# Patient Record
Sex: Male | Born: 1980 | Race: White | Hispanic: No | Marital: Married | Smoking: Former smoker
Health system: Southern US, Community
[De-identification: ages and names within clinical notes are randomized; demographics above are authoritative.]

## PROBLEM LIST (undated history)

## (undated) DIAGNOSIS — I10 Essential (primary) hypertension: Secondary | ICD-10-CM

## (undated) DIAGNOSIS — J45909 Unspecified asthma, uncomplicated: Secondary | ICD-10-CM

## (undated) HISTORY — DX: Unspecified asthma, uncomplicated: J45.909

## (undated) HISTORY — DX: Essential (primary) hypertension: I10

---

## 2018-08-11 ENCOUNTER — Encounter: Payer: Self-pay | Admitting: Family Medicine

## 2018-08-11 ENCOUNTER — Ambulatory Visit: Payer: BC Managed Care – PPO | Admitting: Family Medicine

## 2018-08-11 VITALS — BP 140/90 | HR 77 | Temp 98.5°F | Resp 15 | Ht 66.0 in | Wt 226.6 lb

## 2018-08-11 DIAGNOSIS — G43109 Migraine with aura, not intractable, without status migrainosus: Secondary | ICD-10-CM

## 2018-08-11 DIAGNOSIS — Z83438 Family history of other disorder of lipoprotein metabolism and other lipidemia: Secondary | ICD-10-CM

## 2018-08-11 DIAGNOSIS — Z8249 Family history of ischemic heart disease and other diseases of the circulatory system: Secondary | ICD-10-CM

## 2018-08-11 LAB — LIPID PANEL
CHOL/HDL RATIO: 4
CHOLESTEROL: 185 mg/dL (ref 0–200)
HDL: 44.2 mg/dL (ref >39.00)
NonHDL: 141.05
TRIGLYCERIDES: 275 mg/dL — AB (ref 0.0–149.0)
VLDL: 55 mg/dL — ABNORMAL HIGH (ref 0.0–40.0)

## 2018-08-11 LAB — CBC
HEMATOCRIT: 45.8 % (ref 39.0–52.0)
Hemoglobin: 15.6 g/dL (ref 13.0–17.0)
MCHC: 34.1 g/dL (ref 30.0–36.0)
MCV: 86.1 fl (ref 78.0–100.0)
Platelets: 270 10*3/uL (ref 150.0–400.0)
RBC: 5.32 Mil/uL (ref 4.22–5.81)
RDW: 12.6 % (ref 11.5–15.5)
WBC: 7.6 10*3/uL (ref 4.0–10.5)

## 2018-08-11 LAB — COMPREHENSIVE METABOLIC PANEL
ALBUMIN: 4.5 g/dL (ref 3.5–5.2)
ALT: 20 U/L (ref 0–53)
AST: 19 U/L (ref 0–37)
Alkaline Phosphatase: 36 U/L — ABNORMAL LOW (ref 39–117)
BUN: 14 mg/dL (ref 6–23)
CHLORIDE: 105 meq/L (ref 96–112)
CO2: 30 meq/L (ref 19–32)
Calcium: 9.6 mg/dL (ref 8.4–10.5)
Creatinine, Ser: 1.08 mg/dL (ref 0.40–1.50)
GFR: 81.33 mL/min (ref >60.00)
Glucose, Bld: 103 mg/dL — ABNORMAL HIGH (ref 70–99)
POTASSIUM: 4.6 meq/L (ref 3.5–5.1)
SODIUM: 140 meq/L (ref 135–145)
Total Bilirubin: 0.3 mg/dL (ref 0.2–1.2)
Total Protein: 7.5 g/dL (ref 6.0–8.3)

## 2018-08-11 LAB — LDL CHOLESTEROL, DIRECT: Direct LDL: 127 mg/dL

## 2018-08-11 NOTE — Patient Instructions (Signed)
Propranolol is a beta blocker that can be used to control BP and also can help be a preventative medication for migraines.  Try OTC Excedrin migraine to see if this better controls migraine pain.   Take no more than 3600mg  of Ibuprofen in 24 hours, always take ibuprofen with food (this med is tough on your gut)   Recurrent Migraine Headache A migraine headache is very bad, throbbing pain that is usually on one side of your head. Recurrent migraines keep coming back (recurring). Talk with your doctor about what things may bring on (trigger) your migraine headaches. Follow these instructions at home: Medicines  Take over-the-counter and prescription medicines only as told by your doctor.  Do not drive or use heavy machinery while taking prescription pain medicine. Lifestyle  Do not use any products that contain nicotine or tobacco, such as cigarettes and e-cigarettes. If you need help quitting, ask your doctor.  Limit alcohol intake to no more than 1 drink a day for nonpregnant women and 2 drinks a day for men. One drink equals 12 oz of beer, 5 oz of wine, or 1 oz of hard liquor.  Get 7-9 hours of sleep each night.  Lessen any stress in your life. Ask your doctor about ways to lower your stress.  Stay at a healthy weight. Talk with your doctor if you need help losing weight.  Get regular exercise. General instructions  Keep a journal to find out if certain things bring on migraine headaches. For example, write down: ? What you eat and drink. ? How much sleep you get. ? Any change to your diet or medicines.  Lie down in a dark, quiet room when you have a migraine.  Try placing a cool towel over your head when you have a migraine.  Keep lights dim if bright lights bother you or make your migraines worse.  Keep all follow-up visits as told by your doctor. This is important. Contact a doctor if:  Medicine does not help your migraines.  Your pain keeps coming back.  You have  a fever.  You have weight loss without trying. Get help right away if:  Your migraine becomes really bad and medicine does not help.  You have a stiff neck.  You have trouble seeing.  Your muscles are weak or you lose control of your muscles.  You lose your balance or have trouble walking.  You feel like you will pass out (faint) or you pass out.  You have really bad symptoms that are different than your first symptoms.  You start having sudden, very bad headaches that last for one second or less, like a thunderclap. Summary  A migraine headache is very bad, throbbing pain that is usually on one side of your head.  Talk with your doctor about what things may bring on (trigger) your migraine headaches.  Take over-the-counter and prescription medicines only as told by your doctor.  Lie down in a dark, quiet room when you have a migraine.  Keep a journal about what you eat and drink, how much sleep you get, and any changes to your medicines. This can help you find out if certain things make you have migraine headaches. This information is not intended to replace advice given to you by your health care provider. Make sure you discuss any questions you have with your health care provider. Document Released: 08/31/2008 Document Revised: 10/15/2016 Document Reviewed: 10/15/2016 Elsevier Interactive Patient Education  2017 [employer].

## 2018-08-11 NOTE — Progress Notes (Signed)
Subjective:    Patient ID: Phillip Lamb, male    DOB: 1981-05-25, 37 y.o.   MRN: [id number]  HPI   Patient presents to clinic to establish primary care.  Patient is a Education officer, environmental, he is married.  Patient has upcoming appointment with optometrist for evaluation of vision.  He sees dentist regularly.  Currently takes no medications other than ibuprofen as needed for headaches.  States in the past he had took medication for blood pressure control, but worked on diet and exercise and was able to get off medication.  States he has gained weight back and has noticed blood pressures have been going up.  Also complains of increasing occurrences of migraine headaches.  States he used to get headaches occasionally, but now has been getting a migraine headache with aura 2-4 times a month.  He will take a dose of 800 mg ibuprofen and this usually works help calm headache down.  States his aura begins as a sparkling next to left eye, then will have lot of head pain behind left eye.  Past medical history, surgical history, social history reviewed.  Patient Active Problem List   Diagnosis Date Noted  . Family history of hypertension 08/14/2018  . Migraine with aura and without status migrainosus, not intractable 08/14/2018  . Family history of hyperlipidemia 08/14/2018   Past Medical History:  Diagnosis Date  . Asthma   . Hypertension     Social History   Tobacco Use  . Smoking status: Former Games developer  . Smokeless tobacco: Never Used  Substance Use Topics  . Alcohol use: Yes   Family History  Problem Relation Age of Onset  . Arthritis Mother   . Early death Brother   . Kidney disease Brother   . Learning disabilities Brother   . Mental retardation Brother   . Hypertension Brother   . Hyperlipidemia Brother    History reviewed. No pertinent surgical history.  Review of Systems   Constitutional: Negative for chills, fatigue and fever.  HENT: Negative for congestion, ear pain,  sinus pain and sore throat.   Eyes: Negative.   Respiratory: Negative for cough, shortness of breath and wheezing.   Cardiovascular: Negative for chest pain, palpitations and leg swelling.  Gastrointestinal: Negative for abdominal pain, diarrhea, nausea and vomiting.  Genitourinary: Negative for dysuria, frequency and urgency.  Musculoskeletal: Negative for arthralgias and myalgias.  Skin: Negative for color change, pallor and rash.  Neurological: Negative for syncope, light-headedness. Migraine headaches 2-4 times per month.  Psychiatric/Behavioral: The patient is not nervous/anxious.       Objective:   Physical Exam  Constitutional: He is oriented to person, place, and time. He appears well-developed and well-nourished. No distress.  HENT:  Head: Normocephalic and atraumatic.  Eyes: Pupils are equal, round, and reactive to light. Conjunctivae and EOM are normal. No scleral icterus.  Neck: Normal range of motion. Neck supple. No tracheal deviation present.  Cardiovascular: Normal rate, regular rhythm and normal heart sounds.  Pulmonary/Chest: Effort normal and breath sounds normal. No respiratory distress. He has no wheezes. He has no rales.  Abdominal: Soft. Bowel sounds are normal. There is no tenderness.  Neurological: He is alert and oriented to person, place, and time.  Gait normal  Skin: Skin is warm and dry. He is not diaphoretic. No pallor.  Psychiatric: He has a normal mood and affect. His behavior is normal. Thought content normal.  Nursing note and vitals reviewed.  Vitals:   08/11/18 1410  BP: 140/90  Pulse: 77  Resp: 15  Temp: 98.5 F (36.9 C)  SpO2: 97%      Assessment & Plan:   Family history & personal history of hypertension --patient's blood pressure is on edge of being in hypertension range today in clinic.  Patient would like to work on healthy diet and regular exercise prior to getting on a new medication.  Family history of hyperlipidemia- lab work  drawn today in clinic including lipid panel, electrolyte panel, CBC.  Migraine headache with aura- patient advised he can try over-the-counter Excedrin Migraine to treat headache pain.  Excedrin Migraine is a combination of aspirin, Tylenol, caffeine within the pill which tends to work well to treat headache pain.  Also discussed other prescription options for migraine pain control included as needed Imitrex or once daily medications such as amitriptyline or a propranolol that can be used daily to prevent migraine headaches from recurring.  Patient states he would like to try over-the-counter Excedrin Migraine first, also states he will discuss other prescription options I mentioned with his wife and get back to me if he wants to try any of those.  Patient aware he will be contacted once lab results are available.  Patient will follow-up in 3 months for monitoring of blood pressure and migraine.

## 2018-08-14 ENCOUNTER — Encounter: Payer: Self-pay | Admitting: Family Medicine

## 2018-08-14 DIAGNOSIS — Z83438 Family history of other disorder of lipoprotein metabolism and other lipidemia: Secondary | ICD-10-CM | POA: Insufficient documentation

## 2018-08-14 DIAGNOSIS — Z8249 Family history of ischemic heart disease and other diseases of the circulatory system: Secondary | ICD-10-CM | POA: Insufficient documentation

## 2018-08-14 DIAGNOSIS — G43119 Migraine with aura, intractable, without status migrainosus: Secondary | ICD-10-CM | POA: Insufficient documentation

## 2018-08-14 DIAGNOSIS — G43109 Migraine with aura, not intractable, without status migrainosus: Secondary | ICD-10-CM

## 2018-11-10 ENCOUNTER — Ambulatory Visit: Payer: BC Managed Care – PPO | Admitting: Family Medicine

## 2018-11-10 ENCOUNTER — Encounter: Payer: Self-pay | Admitting: Family Medicine

## 2018-11-10 VITALS — BP 136/88 | HR 77 | Temp 97.8°F | Ht 66.0 in | Wt 226.6 lb

## 2018-11-10 DIAGNOSIS — E781 Pure hyperglyceridemia: Secondary | ICD-10-CM | POA: Diagnosis not present

## 2018-11-10 DIAGNOSIS — G43109 Migraine with aura, not intractable, without status migrainosus: Secondary | ICD-10-CM | POA: Diagnosis not present

## 2018-11-10 DIAGNOSIS — R030 Elevated blood-pressure reading, without diagnosis of hypertension: Secondary | ICD-10-CM | POA: Diagnosis not present

## 2018-11-10 NOTE — Progress Notes (Signed)
Subjective:    Patient ID: Phillip Lamb, male    DOB: 08-04-1981, 37 y.o.   MRN: [id number]  HPI   Presents to clinic for follow up on BP and Migraine headaches  His BP had been running a little higher than we would like, but he wanted to work on diet and exercise before starting any meds  Also he was getting 2-4 migraine headaches a week, was taking OTC advil with some effect. We discussed different options and he was going to try OTC excedrin first then if that was not effective, we could try Rx migraine meds.  Patient states he did try taking an over-the-counter Excedrin when he had a migraine headache and this was somewhat effective, he believes he took the Excedrin too late in the process of headache and He plans to take Excedrin when he feels a headache coming on in the first place. Had 2 migraines in past 3 months.   Patient also has been working on eating a better diet and being more active.  His goal is to not have to take anything for blood pressure.  Triglycerides were elevated with last lab work, we will recheck. Patient has been fasting today due to an event with his church.   Patient Active Problem List   Diagnosis Date Noted  . Family history of hypertension 08/14/2018  . Migraine with aura and without status migrainosus, not intractable 08/14/2018  . Family history of hyperlipidemia 08/14/2018   Social History   Tobacco Use  . Smoking status: Former Games developer  . Smokeless tobacco: Never Used  Substance Use Topics  . Alcohol use: Yes    Review of Systems  Constitutional: Negative for chills, fatigue and fever.  HENT: Negative for congestion, ear pain, sinus pain and sore throat.   Eyes: Negative.   Respiratory: Negative for cough, shortness of breath and wheezing.   Cardiovascular: Negative for chest pain, palpitations and leg swelling.  Gastrointestinal: Negative for abdominal pain, diarrhea, nausea and vomiting.  Genitourinary: Negative for dysuria,  frequency and urgency.  Musculoskeletal: Negative for arthralgias and myalgias.  Skin: Negative for color change, pallor and rash.  Neurological: Negative for syncope, light-headedness and headaches.  Psychiatric/Behavioral: The patient is not nervous/anxious.       Objective:   Physical Exam  Constitutional: He appears well-developed and well-nourished. No distress.  HENT:  Head: Normocephalic and atraumatic.  Eyes: Pupils are equal, round, and reactive to light. Conjunctivae and EOM are normal. No scleral icterus.  Neck: Normal range of motion. Neck supple. No tracheal deviation present.  Cardiovascular: Normal rate, regular rhythm and normal heart sounds. No LE edema. No carotid bruit.  Pulmonary/Chest: Effort normal and breath sounds normal. No respiratory distress. He has no wheezes. He has no rales.  Neurological: He is alert and oriented to person, place, and time.  Speech clear, smile symmetrical, can raise eyebrows and puff out cheeks without issue.  Can clench teeth.  Grips equal and strong. Gait normal  Skin: Skin is warm and dry. He is not diaphoretic. No pallor.  Psychiatric: He has a normal mood and affect. His behavior is normal. Thought content normal.   Nursing note and vitals reviewed.    Vitals:   11/10/18 1626  BP: 136/88  Pulse: 77  Temp: 97.8 F (36.6 C)  SpO2: 98%   BP Readings from Last 3 Encounters:  11/10/18 136/88  08/11/18 140/90    Assessment & Plan:   High triglycerides-we will recheck lab work today.  Encourage patient to continue working on healthy diet and regular exercise with goal of weight loss and reducing cholesterol levels.  Elevated BP without diagnosis of hypertension-patient's blood pressure was on the edge at last visit at 140/90, is somewhat improved today at 136/88.  Patient and I had discussion of different options and he would like to continue working on eating healthier and be more active prior to taking any medicine.  Migraine  headaches-patient has only had 2 migraine headaches in the past 3 months.  He will continue to use over-the-counter remedies such as Excedrin as needed for headache treatment.  Patient advised that if he begins to having headaches more frequently, he can let me know and we can start a daily preventative medicine such as Topamax at bedtime or propranolol.  Patient will follow-up here in approximately 6 months for recheck of triglycerides/blood pressure.  Patient is aware he can return to clinic sooner if any issues arise.

## 2018-11-11 LAB — LIPID PANEL
CHOL/HDL RATIO: 4.4 (calc) (ref <5.0)
Cholesterol: 199 mg/dL (ref <200)
HDL: 45 mg/dL (ref >40)
LDL Cholesterol (Calc): 130 mg/dL (calc) — ABNORMAL HIGH
Non-HDL Cholesterol (Calc): 154 mg/dL (calc) — ABNORMAL HIGH (ref <130)
Triglycerides: 128 mg/dL (ref <150)

## 2018-11-13 ENCOUNTER — Encounter: Payer: Self-pay | Admitting: Family Medicine

## 2018-11-15 ENCOUNTER — Telehealth: Payer: Self-pay | Admitting: *Deleted

## 2018-11-15 NOTE — Telephone Encounter (Signed)
Copied from CRM [phone]. Topic: Quick Communication - Lab Results (Clinic Use ONLY) >> Nov 15, 2018  1:09 PM Clearnce SorrelPickard, Jill S, [place] wrote: Called patient to inform them of 11/14/2018 lab results. When patient returns call, triage nurse may disclose results. >> Nov 15, 2018  1:41 PM Waymon AmatoBurton, Donna F wrote: Pt is returning office call for lab results -NT is busy

## 2018-11-15 NOTE — Telephone Encounter (Signed)
Called Pt to give him his lab results, Pt stated he understood and had no questions

## 2019-04-03 ENCOUNTER — Ambulatory Visit: Payer: Self-pay | Admitting: *Deleted

## 2019-04-03 NOTE — Telephone Encounter (Signed)
Pt's wife called (on [place]) with her husband. she stated he is  having headaches, which she states are migraines that started in August. Sometimes he looses his peripheral vision and has trouble finding his words . She stated this time he is having a migraine that comes and goes. Advised that if he is losing his words, weakness, facial drooping, she should call 911. She voiced understanding. They thought it was the screen time, he uses the computer a lot. But had this episode today without being on a computer.  He has not been officially diagnosed with migraines. He takes ibuprofen for the pain which does not always helps.   Advised for him to take ibuprofen as needed but no more than, but no more than 800 mg 3 times a day, to lie down in a dark room, use a cool cloth to his head. She voiced understanding and is doing that already. Requesting a call back tomorrow from his provider. Routing to flow at [facility] at [facility].   Reason for Disposition . [1] MODERATE headache (e.g., interferes with normal activities) AND [2] present > 24 hours AND [3] unexplained  (Exceptions: analgesics not tried, typical migraine, or headache part of viral illness)  Answer Assessment - Initial Assessment Questions 1. LOCATION: "Where does it hurt?"      Behind the right eye 2. ONSET: "When did the headache start?" (Minutes, hours or days)      Off and on since August and has gotten progressively worst 3. PATTERN: "Does the pain come and go, or has it been constant since it started?"     Comes and goes 4. SEVERITY: "How bad is the pain?" and "What does it keep you from doing?"  (e.g., Scale 1-10; mild, moderate, or severe)   - MILD (1-3): doesn't interfere with normal activities    - MODERATE (4-7): interferes with normal activities or awakens from sleep    - SEVERE (8-10): excruciating pain, unable to do any normal activities        Pain # is 5 or 6 not worst right now 5. RECURRENT SYMPTOM: "Have you ever had  headaches before?" If so, ask: "When was the last time?" and "What happened that time?"      Yes, takes Excedrin, and  ibuprofen 6. CAUSE: "What do you think is causing the headache?"     Thought it was screen time and now gets them without being in front of a screen 7. MIGRAINE: "Have you been diagnosed with migraine headaches?" If so, ask: "Is this headache similars ?"      no 8. HEAD INJURY: "Has there been any recent injury to the head?"      no 9. OTHER SYMPTOMS: "Do you have any other symptoms?" (fever, stiff neck, eye pain, sore throat, cold symptoms)     Trouble with finding words 10. PREGNANCY: "Is there any chance you are pregnant?" "When was your last menstrual period?"       no  Protocols used: HEADACHE-A-AH

## 2019-04-04 ENCOUNTER — Encounter: Payer: Self-pay | Admitting: Family Medicine

## 2019-04-04 ENCOUNTER — Ambulatory Visit (INDEPENDENT_AMBULATORY_CARE_PROVIDER_SITE_OTHER): Payer: BC Managed Care – PPO | Admitting: Family Medicine

## 2019-04-04 ENCOUNTER — Telehealth: Payer: Self-pay | Admitting: Family Medicine

## 2019-04-04 ENCOUNTER — Other Ambulatory Visit: Payer: Self-pay

## 2019-04-04 DIAGNOSIS — G43119 Migraine with aura, intractable, without status migrainosus: Secondary | ICD-10-CM | POA: Diagnosis not present

## 2019-04-04 DIAGNOSIS — M542 Cervicalgia: Secondary | ICD-10-CM

## 2019-04-04 DIAGNOSIS — G8929 Other chronic pain: Secondary | ICD-10-CM

## 2019-04-04 DIAGNOSIS — R51 Headache: Secondary | ICD-10-CM | POA: Diagnosis not present

## 2019-04-04 DIAGNOSIS — M546 Pain in thoracic spine: Secondary | ICD-10-CM | POA: Diagnosis not present

## 2019-04-04 MED ORDER — SUMATRIPTAN SUCCINATE 50 MG PO TABS: 50.0000 mg | ORAL_TABLET | ORAL | 1 refills | Status: DC | PRN

## 2019-04-04 MED ORDER — PROPRANOLOL HCL ER 60 MG PO CP24: 60.0000 mg | ORAL_CAPSULE | Freq: Every day | ORAL | 2 refills | Status: DC

## 2019-04-04 NOTE — Telephone Encounter (Signed)
Called and spoke to patient.  Patient stated that he took some ibuprofen last night around 9:00 pm and said that his head felt better this morning.  Pt stated he started having migraines back in August and feels that the migraines are stemming from his back pain that he's had since college.   Pt rated back pain today 2 or 3 out of 10 and no head pain this morning.  Pt scheduled for virtual visit this morning with PCP @ 8:40 am.

## 2019-04-04 NOTE — Telephone Encounter (Signed)
Please schedule him for follow up on may 27th at 9 AM  Thanks!  LG

## 2019-04-04 NOTE — Telephone Encounter (Signed)
FYI Called and spoke to patient.  Patient stated that he took some ibuprofen last night around 9:00 pm and said that his head felt better this morning.  Pt stated he started having migraines back in August and feels that the migraines are stemming from his back pain that he's had since college.   Pt rated back pain today 2 or 3 out of 10 and no head pain this morning.  Pt scheduled for virtual visit this morning with PCP @ 8:40 am.

## 2019-04-04 NOTE — Telephone Encounter (Signed)
F/U Doxy visit scheduled

## 2019-04-04 NOTE — Progress Notes (Signed)
Patient ID: Phillip Lamb, male   DOB: 12/09/1980, 38 y.o.   MRN: [id number]  Virtual Visit via video Note  This visit type was conducted due to national recommendations for restrictions regarding the COVID-19 pandemic (e.g. social distancing).  This format is felt to be most appropriate for this patient at this time.  All issues noted in this document were discussed and addressed.  No physical exam was performed (except for noted visual exam findings with Video Visits).   I connected with Phillip Lamb on 04/04/19 at  8:40 AM EDT by a video enabled telemedicine application and verified that I am speaking with the correct person using two identifiers. Location patient: home Location provider: [facility] Persons participating in the virtual visit: patient, provider  I discussed the limitations, risks, security and privacy concerns of performing an evaluation and management service by video and the availability of in person appointments. I also discussed with the patient that there may be a patient responsible charge related to this service. The patient expressed understanding and agreed to proceed.   HPI:  Patient and I connected via video today to discuss migraines and also mid back pain and neck pain that seem to be exacerbating migraines.  Patient has struggled with migraines off and on for years.  Currently uses ibuprofen or Excedrin Migraine as needed for pain control, but notices the migraines are becoming more frequent and becoming more severe.  States he will get an aura on either left or right side of head near her eye and then the pain in head will become severe and he will need to take a dose of ibuprofen and sleep for many hours to help reduce migraine.  Sometimes after sleeping, he will wake up still with a slight headache.  Most recently he had an itch on the right side of his head and scratch the right side of his head to appease his itch, after scratching the right side of  his head he began to see Phillip Lamb out of the corner of right eye and a painful migraine began.  States when he gets migraines he will also notice issues with memory and recall not being as sharp.  States the migraine pain will be so bad this when they can concentrate on.  Previously has been hesitant to take any sort of daily preventative migraine medicine, however due to his migraines becoming more persistent and more severe he is now interested.  States she has had at least 12-15 severe migraines in the past 8 months.  Patient has also had chronic mid back pain for years stemming from an old injury.  Mainly he is treated this with ibuprofen as needed.  States when his back pain flares up in mid back and will radiate up into the neck which then also seems to exacerbate a headache.  Not sure if it is the back pain or the headache that causes each other or if the combination of both.  Denies facial weakness or one-sided extremity weakness.  Denies sharp thunderclap type pain in head, denies speech difficulties.  Denies balance issues.  Denies tingling or numbness in arms or legs.  Denies chest pain.  Denies fever or chills.  Denies any head injury.  Denies body aches.  Denies GI or GU issues.    ROS: See pertinent positives and negatives per HPI.  Past Medical History:  Diagnosis Date  . Asthma   . Hypertension     Family History  Problem Relation Age of Onset  .  Arthritis Mother   . Early death Brother   . Kidney disease Brother   . Learning disabilities Brother   . Mental retardation Brother   . Hypertension Brother   . Hyperlipidemia Brother    Social History   Tobacco Use  . Smoking status: Former Games developer  . Smokeless tobacco: Never Used  Substance Use Topics  . Alcohol use: Yes      Current Outpatient Medications:  .  ibuprofen (ADVIL,MOTRIN) 800 MG tablet, Take 800 mg by mouth every 8 (eight) hours as needed., Disp: , Rfl:  .  propranolol ER (INDERAL LA) 60 MG 24 hr capsule,  Take 1 capsule (60 mg total) by mouth at bedtime., Disp: 30 capsule, Rfl: 2 .  SUMAtriptan (IMITREX) 50 MG tablet, Take 1 tablet (50 mg total) by mouth every 2 (two) hours as needed for migraine. May repeat in 2 hours if headache persists or recurs. Max 4 tablets in 24 hours., Disp: 10 tablet, Rfl: 1  EXAM:  GENERAL: alert, oriented, appears well and in no acute distress  HEENT: atraumatic, conjunttiva clear, no obvious abnormalities on inspection of external nose and ears  NECK: normal movements of the head and neck  LUNGS: on inspection no signs of respiratory distress, breathing rate appears normal, no obvious gross SOB, gasping or wheezing  CV: no obvious cyanosis  MS: moves all visible extremities without noticeable abnormality  PSYCH/NEURO: pleasant and cooperative, no obvious depression or anxiety, speech and thought processing grossly intact  ASSESSMENT AND PLAN:  Discussed the following assessment and plan:  Intractable migraine with aura without status migrainosus - Plan: propranolol ER (INDERAL LA) 60 MG 24 hr capsule, SUMAtriptan (IMITREX) 50 MG tablet, MR Brain Wo Contrast, MR MRA HEAD WO CONTRAST, MR Cervical Spine Wo Contrast, MR Thoracic Spine Wo Contrast  Chronic intractable headache, unspecified headache type - Plan: MR Brain Wo Contrast, MR MRA HEAD WO CONTRAST  Chronic nonintractable headache, unspecified headache type - Plan: MR Cervical Spine Wo Contrast  Chronic thoracic spine pain - Plan: MR Thoracic Spine Wo Contrast  Chronic neck pain - Plan: MR Cervical Spine Wo Contrast  Due to severity of patient's migraines increasing frequency, he will begin taking propranolol once daily at bedtime for prevention purposes.  He will trial Imitrex as needed for breakthrough migraine pain treatment.  Also discussed keeping self well-hydrated and eating a balanced diet and trying to get proper amounts of sleep.  It is potentially possible that the patient's back and neck  pain is contributing to his severe migraines.  We will order MRIs of brain, thoracic spine and neck to further investigate all of this.  I discussed the assessment and treatment plan with the patient. The patient was provided an opportunity to ask questions and all were answered. The patient agreed with the plan and demonstrated an understanding of the instructions.   The patient was advised to call back or seek an in-person evaluation if the symptoms worsen or if the condition fails to improve as anticipated.  Patient will follow-up in approximately 4 weeks for recheck on migraine after addition of propranolol and Imitrex as needed.  He is aware that scheduling of his MRIs may take approximately 4 to 8 weeks due to outpatient nonemergent imaging being pushed back because of coronavirus restrictions.  A total of 25  minutes were spent face-to-face via video with the patient during this encounter and over half of that time was spent on counseling and coordination of care. The patient was counseled  on options for migraine treatment, further testing that we should do to investigate his migraines.  Tracey Harries, FNP

## 2019-04-05 NOTE — Telephone Encounter (Signed)
Pt wife called back regarding phone call. Pt wife stated that pt is having another migraine.   Please call wife @ [phone] Thank you!

## 2019-04-05 NOTE — Telephone Encounter (Signed)
Has he tried the imitrex that was prescribed for migraine treatment? This med should help to break the migraine.  The propranolol is to prevent migraines, and is taken every day before bed.  Imitrex is to treat breakthrough migraine.

## 2019-04-05 NOTE — Telephone Encounter (Signed)
Yes he can take ibuprofen with the imitrex  I am also hopeful that the longer he is on the propranolol as prevention, the less severe his migraines will be

## 2019-04-05 NOTE — Telephone Encounter (Signed)
Called and spoke to the Pt about him being able to take the Ibuprofen. I also told the Pt that the longer he take the  Propranolol the less his migraines should be.

## 2019-04-05 NOTE — Telephone Encounter (Signed)
Called Pt and spoke to Pt wife. The wife stated the Pt took the Imitrex and it did help a lot, his vision is back, he's able to get up and move around. But the Pt still feels pressure and wants to know if he can take Ibuprofen to help with that.

## 2019-04-05 NOTE — Telephone Encounter (Signed)
Pt called [facility] Pt wife called back regarding phone call. Pt wife stated that pt is having another migraine.   Please call wife @ [phone] Thank you!

## 2019-04-17 ENCOUNTER — Other Ambulatory Visit: Payer: Self-pay

## 2019-04-17 ENCOUNTER — Ambulatory Visit
Admission: RE | Admit: 2019-04-17 | Discharge: 2019-04-17 | Disposition: A | Discharge status: Home / Self Care | Payer: BC Managed Care – PPO | Source: Ambulatory Visit | Attending: Family Medicine | Admitting: Family Medicine

## 2019-04-17 DIAGNOSIS — M546 Pain in thoracic spine: Secondary | ICD-10-CM | POA: Insufficient documentation

## 2019-04-17 DIAGNOSIS — M542 Cervicalgia: Secondary | ICD-10-CM | POA: Insufficient documentation

## 2019-04-17 DIAGNOSIS — R51 Headache: Secondary | ICD-10-CM | POA: Insufficient documentation

## 2019-04-17 DIAGNOSIS — G8929 Other chronic pain: Secondary | ICD-10-CM

## 2019-04-17 DIAGNOSIS — G43119 Migraine with aura, intractable, without status migrainosus: Secondary | ICD-10-CM | POA: Insufficient documentation

## 2019-04-17 DIAGNOSIS — R519 Headache, unspecified: Secondary | ICD-10-CM

## 2019-04-19 ENCOUNTER — Other Ambulatory Visit: Payer: Self-pay | Admitting: Family Medicine

## 2019-04-19 DIAGNOSIS — R519 Headache, unspecified: Secondary | ICD-10-CM

## 2019-04-19 DIAGNOSIS — G43109 Migraine with aura, not intractable, without status migrainosus: Secondary | ICD-10-CM

## 2019-04-19 NOTE — Progress Notes (Signed)
CTA brain order

## 2019-04-20 ENCOUNTER — Telehealth: Payer: Self-pay

## 2019-04-20 NOTE — Telephone Encounter (Signed)
Attempted to call pt. No answer no voicemail.  

## 2019-04-20 NOTE — Telephone Encounter (Signed)
Copied from CRM [id number]. Topic: Quick Communication - Lab Results (Clinic Use ONLY) >> Apr 19, 2019  3:36 PM Fanny Bien wrote: Pt would like MRI results released to [facility]

## 2019-04-20 NOTE — Telephone Encounter (Signed)
Lauren reviewed results 2 days ago 04/18/19  Can patient not see them? He appears active on my chart  Can you help with this?

## 2019-04-23 NOTE — Telephone Encounter (Signed)
Called Pt and he stated he was finally able to see his MRI results.

## 2019-04-23 NOTE — Telephone Encounter (Signed)
Results all should be released.

## 2019-04-24 ENCOUNTER — Ambulatory Visit: Payer: BC Managed Care – PPO

## 2019-04-26 ENCOUNTER — Other Ambulatory Visit: Payer: Self-pay

## 2019-04-26 ENCOUNTER — Ambulatory Visit
Admission: RE | Admit: 2019-04-26 | Discharge: 2019-04-26 | Disposition: A | Discharge status: Home / Self Care | Payer: BC Managed Care – PPO | Source: Ambulatory Visit | Attending: Family Medicine | Admitting: Family Medicine

## 2019-04-26 DIAGNOSIS — R51 Headache: Secondary | ICD-10-CM | POA: Insufficient documentation

## 2019-04-26 DIAGNOSIS — G43109 Migraine with aura, not intractable, without status migrainosus: Secondary | ICD-10-CM | POA: Insufficient documentation

## 2019-04-26 DIAGNOSIS — G8929 Other chronic pain: Secondary | ICD-10-CM

## 2019-04-26 MED ORDER — IOPAMIDOL (ISOVUE-370) INJECTION 76%
100.0000 mL | Freq: Once | INTRAVENOUS | Status: AC | PRN
  Administered 2019-04-26: 75 mL via INTRAVENOUS

## 2019-05-02 ENCOUNTER — Encounter: Payer: Self-pay | Admitting: Family Medicine

## 2019-05-02 ENCOUNTER — Other Ambulatory Visit: Payer: Self-pay

## 2019-05-02 ENCOUNTER — Ambulatory Visit (INDEPENDENT_AMBULATORY_CARE_PROVIDER_SITE_OTHER): Payer: BC Managed Care – PPO | Admitting: Family Medicine

## 2019-05-02 VITALS — BP 120/70 | HR 63 | Temp 98.1°F | Ht 66.0 in | Wt 222.8 lb

## 2019-05-02 DIAGNOSIS — G43119 Migraine with aura, intractable, without status migrainosus: Secondary | ICD-10-CM

## 2019-05-02 MED ORDER — TOPIRAMATE 50 MG PO TABS: ORAL_TABLET | ORAL | 2 refills | Status: DC

## 2019-05-02 NOTE — Progress Notes (Signed)
Subjective:    Patient ID: Phillip Lamb, male    DOB: [birth year], 38 y.o.   MRN: [id number]  HPI   Patient presents to clinic for follow-up on migraines after starting propranolol and Imitrex as needed.  We did do MRI, MRA and CTA of brain to look for any possible anatomical cause that could lead to his migraines, scans all came back negative for abnormality.  Patient states since adding the propranolol he has noticed he is not having a daily headache, will have a slight headache on occasion and reports he did have 2 migraines last week.  When he had his migraines last week, used doses of Imitrex with success in reducing migraine pain.  He also does take ibuprofen for both his headache and also chronic back pain.  Patient Active Problem List   Diagnosis Date Noted  . Family history of hypertension 08/14/2018  . Intractable migraine with aura without status migrainosus 08/14/2018  . Family history of hyperlipidemia 08/14/2018   Social History   Tobacco Use  . Smoking status: Former Games developer  . Smokeless tobacco: Never Used  Substance Use Topics  . Alcohol use: Yes    Family History  Problem Relation Age of Onset  . Arthritis Mother   . Early death Brother   . Kidney disease Brother   . Learning disabilities Brother   . Mental retardation Brother   . Hypertension Brother   . Hyperlipidemia Brother      Review of Systems  Constitutional: Negative for chills, fatigue and fever.  HENT: Negative for congestion, ear pain, sinus pain and sore throat.   Eyes: Negative.   Respiratory: Negative for cough, shortness of breath and wheezing.   Cardiovascular: Negative for chest pain, palpitations and leg swelling.  Gastrointestinal: Negative for abdominal pain, diarrhea, nausea and vomiting.  Genitourinary: Negative for dysuria, frequency and urgency.  Musculoskeletal: Negative for arthralgias and myalgias.  Skin: Negative for color change, pallor and rash.  Neurological:  Negative for syncope, light-headedness. +headahces, migraines  Psychiatric/Behavioral: The patient is not nervous/anxious.       Objective:   Physical Exam Vitals signs and nursing note reviewed.  Constitutional:      General: He is not in acute distress.    Appearance: He is not toxic-appearing.  HENT:     Head: Normocephalic and atraumatic.     Right Ear: Tympanic membrane, ear canal and external ear normal.     Left Ear: Tympanic membrane, ear canal and external ear normal.     Mouth/Throat:     Mouth: Mucous membranes are moist.  Eyes:     General: No scleral icterus.    Extraocular Movements: Extraocular movements intact.     Conjunctiva/sclera: Conjunctivae normal.     Pupils: Pupils are equal, round, and reactive to light.  Neck:     Musculoskeletal: Normal range of motion and neck supple. No neck rigidity.  Cardiovascular:     Rate and Rhythm: Normal rate and regular rhythm.     Heart sounds: Normal heart sounds.  Pulmonary:     Effort: Pulmonary effort is normal. No respiratory distress.     Breath sounds: Normal breath sounds. No wheezing, rhonchi or rales.  Skin:    General: Skin is warm and dry.  Neurological:     General: No focal deficit present.     Mental Status: He is alert and oriented to person, place, and time.     Gait: Gait normal.  Psychiatric:  Mood and Affect: Mood normal.        Behavior: Behavior normal.    Today's Vitals   05/02/19 0910  BP: 120/70  Pulse: 63  Temp: 98.1 F (36.7 C)  TempSrc: Oral  SpO2: 97%  Weight: 222 lb 12.8 oz (101.1 kg)  Height:  (1.676 m)   Body mass index is 35.96 kg/m.     Assessment & Plan:    Migraine- patient's migraines have been difficult to control.  The propranolol does seem to be making some of a difference in helping reduce his migraine and headache pain however he still having migraines fairly frequently.  His BP and heart rate are stable but on lower end, so I will not increase  propranolol.  Instead we will add Topamax to see if this helps improve migraine pain.  We will have patient follow-up in 3 to 4 weeks for recheck on migraine pain after addition of Topamax.  If he is continuing to have a lot of breakthrough migraines, we will consider neurology referral.

## 2019-05-02 NOTE — Progress Notes (Signed)
Pre visit review using our clinic review tool, if applicable. No additional management support is needed unless otherwise documented below in the visit note. 

## 2019-05-23 ENCOUNTER — Ambulatory Visit (INDEPENDENT_AMBULATORY_CARE_PROVIDER_SITE_OTHER): Payer: BC Managed Care – PPO | Admitting: Family Medicine

## 2019-05-23 ENCOUNTER — Other Ambulatory Visit: Payer: Self-pay

## 2019-05-23 DIAGNOSIS — G8929 Other chronic pain: Secondary | ICD-10-CM | POA: Diagnosis not present

## 2019-05-23 DIAGNOSIS — G43119 Migraine with aura, intractable, without status migrainosus: Secondary | ICD-10-CM

## 2019-05-23 DIAGNOSIS — M546 Pain in thoracic spine: Secondary | ICD-10-CM

## 2019-05-23 NOTE — Progress Notes (Signed)
Patient ID: Phillip Lamb, male   DOB: 03/26/1981, 38 y.o.   MRN: [id number]    Virtual Visit via video Note  This visit type was conducted due to national recommendations for restrictions regarding the COVID-19 pandemic (e.g. social distancing).  This format is felt to be most appropriate for this patient at this time.  All issues noted in this document were discussed and addressed.  No physical exam was performed (except for noted visual exam findings with Video Visits).   I connected with Phillip Lamb today at  8:20 AM EDT by a video enabled telemedicine application and verified that I am speaking with the correct person using two identifiers. Location patient: home Location provider: work or home office Persons participating in the virtual visit: patient, provider  I discussed the limitations, risks, security and privacy concerns of performing an evaluation and management service by video and the availability of in person appointments. I also discussed with the patient that there may be a patient responsible charge related to this service. The patient expressed understanding and agreed to proceed.  HPI:  Patient and I connected today for follow-up on migraines and chronic headaches.  Since being on the propranolol daily plus topamax daily and using Imitrex as needed, he has found his headaches have begun to subside and he is pleased.  States he is only taking the 50 mg nightly dose of Topamax, try bumping up the dose as a written out on the prescription however when he bumped up the dose he more groggy upon waking in the morning and did not like that, and feels the 50 mg is helpful.  Over the past month or so he has had maybe 2 severe breakthrough migraines, but was able to improve the pain with use of Imitrex and ibuprofen and rest.  Patient also continues to have chronic back pain.  He was wondering if it would be okay to see a chiropractor, has seen chiropractor in the past with success  in helping pain.  Otherwise denies fever or chills, denies cough, shortness of breath or wheezing.  Denies chest pain.  Denies body aches.  Denies GI or GU complaints.  ROS: See pertinent positives and negatives per HPI.  Past Medical History:  Diagnosis Date  . Asthma   . Hypertension     Family History  Problem Relation Age of Onset  . Arthritis Mother   . Early death Brother   . Kidney disease Brother   . Learning disabilities Brother   . Mental retardation Brother   . Hypertension Brother   . Hyperlipidemia Brother    Social History   Tobacco Use  . Smoking status: Former Games developermoker  . Smokeless tobacco: Never Used  Substance Use Topics  . Alcohol use: Yes    Current Outpatient Medications:  .  ibuprofen (ADVIL,MOTRIN) 800 MG tablet, Take 800 mg by mouth every 8 (eight) hours as needed., Disp: , Rfl:  .  propranolol ER (INDERAL LA) 60 MG 24 hr capsule, Take 1 capsule (60 mg total) by mouth at bedtime., Disp: 30 capsule, Rfl: 2 .  SUMAtriptan (IMITREX) 50 MG tablet, Take 1 tablet (50 mg total) by mouth every 2 (two) hours as needed for migraine. May repeat in 2 hours if headache persists or recurs. Max 4 tablets in 24 hours., Disp: 10 tablet, Rfl: 1 .  topiramate (TOPAMAX) 50 MG tablet, Take 25 mg (0.5 tablet) at bedtime for 1 week. Then 50 mg at bedtime for 1 week. Then 50 mg  2x per day., Disp: 60 tablet, Rfl: 2  EXAM:  GENERAL: alert, oriented, appears well and in no acute distress  HEENT: atraumatic, conjunttiva clear, no obvious abnormalities on inspection of external nose and ears  NECK: normal movements of the head and neck  LUNGS: on inspection no signs of respiratory distress, breathing rate appears normal, no obvious gross SOB, gasping or wheezing  CV: no obvious cyanosis  MS: moves all visible extremities without noticeable abnormality  PSYCH/NEURO: pleasant and cooperative, no obvious depression or anxiety, speech and thought processing grossly intact   ASSESSMENT AND PLAN:  Discussed the following assessment and plan:  Migraines- propranolol plus Topamax with Imitrex as needed seem to be working quite well to help patient's migraines.  We will continue on this regimen.  Chronic thoracic spine pain-patient has dealt with pain in this part of his back for many many years.  He does use ibuprofen as needed with overall good success, but advised that if he would like to resume use of chiropractor I believe that would be okay.  MRIs that we did earlier this year did show "tiny" disc protrusions, but I do not believe that this will affect him being able to benefit from chiropractic care.  Advised to make his chiropractor aware of his MRI results and they can proceed forward together with a good plan of care for his pain.   I discussed the assessment and treatment plan with the patient. The patient was provided an opportunity to ask questions and all were answered. The patient agreed with the plan and demonstrated an understanding of the instructions.   The patient was advised to call back or seek an in-person evaluation if the symptoms worsen or if the condition fails to improve as anticipated.  We will plan to have patient follow-up in approximately 3 months for recheck.  Advised he can call clinic anytime with questions or concerns.   Jodelle Green, FNP

## 2019-05-24 ENCOUNTER — Telehealth: Payer: Self-pay | Admitting: Family Medicine

## 2019-05-24 MED ORDER — TOPIRAMATE 50 MG PO TABS: ORAL_TABLET | ORAL | 2 refills | Status: DC

## 2019-05-24 NOTE — Telephone Encounter (Signed)
Please set patient up for 3 month follow up appt  Thanks  LG

## 2019-05-24 NOTE — Telephone Encounter (Signed)
Called Pt No answer left a VM will try back later 

## 2019-05-31 NOTE — Telephone Encounter (Signed)
Called Pt No answer left a VM will try back later 

## 2019-06-06 NOTE — Telephone Encounter (Signed)
Pt appt scheduled

## 2019-08-08 ENCOUNTER — Other Ambulatory Visit: Payer: Self-pay | Admitting: Family Medicine

## 2019-08-08 DIAGNOSIS — G43119 Migraine with aura, intractable, without status migrainosus: Secondary | ICD-10-CM

## 2019-08-31 ENCOUNTER — Ambulatory Visit (INDEPENDENT_AMBULATORY_CARE_PROVIDER_SITE_OTHER): Payer: BC Managed Care – PPO | Admitting: Family Medicine

## 2019-08-31 ENCOUNTER — Other Ambulatory Visit: Payer: Self-pay

## 2019-08-31 DIAGNOSIS — G43119 Migraine with aura, intractable, without status migrainosus: Secondary | ICD-10-CM | POA: Diagnosis not present

## 2019-08-31 MED ORDER — PROPRANOLOL HCL ER 60 MG PO CP24: 60.0000 mg | ORAL_CAPSULE | Freq: Every day | ORAL | 1 refills | Status: AC

## 2019-08-31 MED ORDER — TOPIRAMATE 50 MG PO TABS: ORAL_TABLET | ORAL | 1 refills | Status: DC

## 2019-08-31 NOTE — Progress Notes (Signed)
Patient ID: Phillip Lamb, male   DOB: 1981/03/13, 38 y.o.   MRN: [id number]    Virtual Visit via video Note  This visit type was conducted due to national recommendations for restrictions regarding the COVID-19 pandemic (e.g. social distancing).  This format is felt to be most appropriate for this patient at this time.  All issues noted in this document were discussed and addressed.  No physical exam was performed (except for noted visual exam findings with Video Visits).   I connected with Phillip Lamb today at  9:00 AM EDT by a video enabled telemedicine application and verified that I am speaking with the correct person using two identifiers. Location patient: home Location provider: work or home office Persons participating in the virtual visit: patient, provider  I discussed the limitations, risks, security and privacy concerns of performing an evaluation and management service by video and the availability of in person appointments. I also discussed with the patient that there may be a patient responsible charge related to this service. The patient expressed understanding and agreed to proceed.    HPI:  Patient and I connected via video to follow-up on migraine since addition of Topamax to medications.  Family feels we have on a good combination with Topamax and propranolol prevention and use of Imitrex as needed for breakthrough.  Has not had a breakthrough headache in the past 4 weeks and is very pleased.  Was getting multiple headaches per week previously.  Still does use ibuprofen, but this is more so for his chronic back pain not his headaches.  Otherwise he is feeling well.  No chest pain no shortness breath or wheezing, cough, body aches, GI or GU complaints.   ROS: See pertinent positives and negatives per HPI.  Past Medical History:  Diagnosis Date  . Asthma   . Hypertension     No past surgical history on file.  Family History  Problem Relation Age of Onset   . Arthritis Mother   . Early death Brother   . Kidney disease Brother   . Learning disabilities Brother   . Mental retardation Brother   . Hypertension Brother   . Hyperlipidemia Brother    Social History   Tobacco Use  . Smoking status: Former Research scientist (life sciences)  . Smokeless tobacco: Never Used  Substance Use Topics  . Alcohol use: Yes    Current Outpatient Medications:  .  ibuprofen (ADVIL,MOTRIN) 800 MG tablet, Take 800 mg by mouth every 8 (eight) hours as needed., Disp: , Rfl:  .  propranolol ER (INDERAL LA) 60 MG 24 hr capsule, TAKE 1 CAPSULE (60 MG TOTAL) BY MOUTH AT BEDTIME., Disp: 30 capsule, Rfl: 2 .  SUMAtriptan (IMITREX) 50 MG tablet, Take 1 tablet (50 mg total) by mouth every 2 (two) hours as needed for migraine. May repeat in 2 hours if headache persists or recurs. Max 4 tablets in 24 hours., Disp: 10 tablet, Rfl: 1 .  topiramate (TOPAMAX) 50 MG tablet, Take 50 mg by mouth daily at bedtime, Disp: 30 tablet, Rfl: 2  EXAM:  GENERAL: alert, oriented, appears well and in no acute distress  HEENT: atraumatic, conjunttiva clear, no obvious abnormalities on inspection of external nose and ears  NECK: normal movements of the head and neck  LUNGS: on inspection no signs of respiratory distress, breathing rate appears normal, no obvious gross SOB, gasping or wheezing  CV: no obvious cyanosis  MS: moves all visible extremities without noticeable abnormality  PSYCH/NEURO: pleasant and cooperative, no obvious  depression or anxiety, speech and thought processing grossly intact  ASSESSMENT AND PLAN:  Discussed the following assessment and plan:  He will continue his current medications.  We will plan to follow-up in about 3 months for regular recheck.  He is aware to return clinic anytime if issues arise.  1. Intractable migraine with aura without status migrainosus - propranolol ER (INDERAL LA) 60 MG 24 hr capsule; Take 1 capsule (60 mg total) by mouth at bedtime.  Dispense: 90  capsule; Refill: 1 - topiramate (TOPAMAX) 50 MG tablet; Take 50 mg by mouth daily at bedtime  Dispense: 90 tablet; Refill: 1    I discussed the assessment and treatment plan with the patient. The patient was provided an opportunity to ask questions and all were answered. The patient agreed with the plan and demonstrated an understanding of the instructions.   The patient was advised to call back or seek an in-person evaluation if the symptoms worsen or if the condition fails to improve as anticipated.   Tracey Harries, FNP

## 2019-09-16 ENCOUNTER — Other Ambulatory Visit: Payer: Self-pay | Admitting: Family Medicine

## 2019-09-16 DIAGNOSIS — G43119 Migraine with aura, intractable, without status migrainosus: Secondary | ICD-10-CM

## 2019-10-28 ENCOUNTER — Other Ambulatory Visit: Payer: Self-pay | Admitting: Family Medicine

## 2019-10-28 DIAGNOSIS — G43119 Migraine with aura, intractable, without status migrainosus: Secondary | ICD-10-CM

## 2019-12-21 ENCOUNTER — Telehealth: Payer: Self-pay | Admitting: Family Medicine

## 2019-12-21 NOTE — Telephone Encounter (Signed)
Spoke with patient he an d his wife ae needing letter basically saying they ae mentally and physically fit, due to they are trying to adopt. I advised them to upload to my chart a copy of what's needed and could evaluate from there to see how we can help?

## 2019-12-21 NOTE — Telephone Encounter (Signed)
So he and his wife.

## 2019-12-21 NOTE — Telephone Encounter (Signed)
Noted.  He would need a visit in the office to help determine this as he has not been evaluated by anybody that is currently here.

## 2019-12-21 NOTE — Telephone Encounter (Signed)
Pt states that he and his wife are trying to adopt and the adoption agency needs documentation from both of their charts. Please call pt back to discuss. Pt has appt with social worker next week.  Thanks!

## 2019-12-24 NOTE — Telephone Encounter (Signed)
Scheduled transfer of care with Arnett.

## 2020-01-15 ENCOUNTER — Ambulatory Visit: Payer: BC Managed Care – PPO | Admitting: Family

## 2020-06-20 IMAGING — MR MRI HEAD WITHOUT CONTRAST
20 of 23 series · 33 of 48 positions shown · non-contrast
Comparison: None available.

CLINICAL DATA: Initial evaluation for chronic headaches.

EXAM:
MRI HEAD WITHOUT CONTRAST
MRA HEAD WITHOUT CONTRAST
TECHNIQUE: Multiplanar, multiecho pulse sequences of the brain and surrounding
structures were obtained without intravenous contrast. Angiographic
images of the head were obtained using MRA technique without
contrast.

[Series 5: ax dwi_tracew · axial · 3.0mm · 0.60mm/px · 1 of 55 slices shown]
[im 1/55]
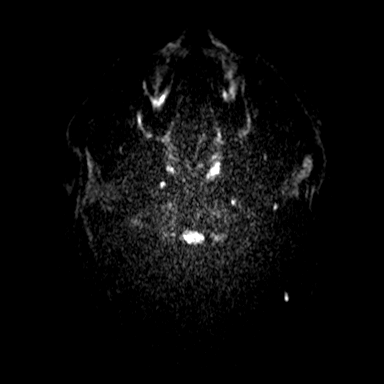

[Series 6: ax dwi_adc · axial · 3.0mm · 0.60mm/px · 1 of 55 slices shown]
[im 1/55]
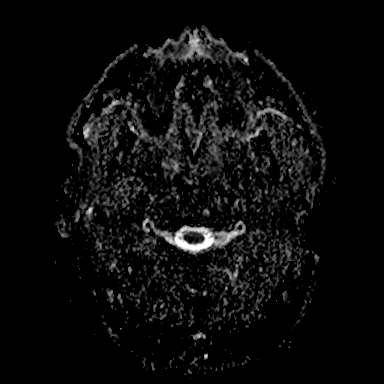

[Series 11: cor dwi_tracew · coronal · 5.0mm · 0.60mm/px · 2 of 41 slices shown]
[im 1/41]
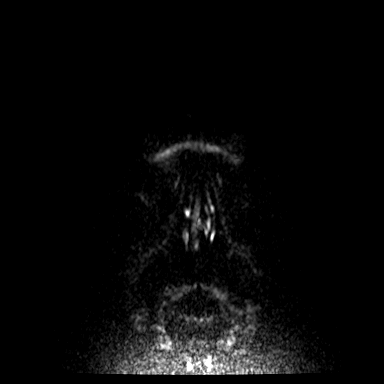
[im 41/41]
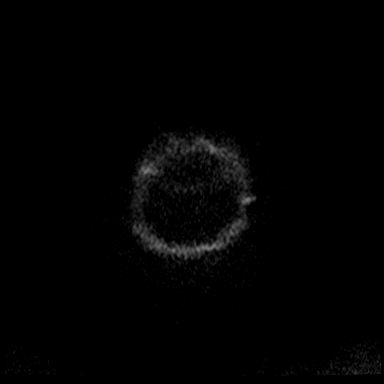

[Series 12: cor dwi_adc · coronal · 5.0mm · 0.60mm/px · 2 of 41 slices shown]
[im 1/41]
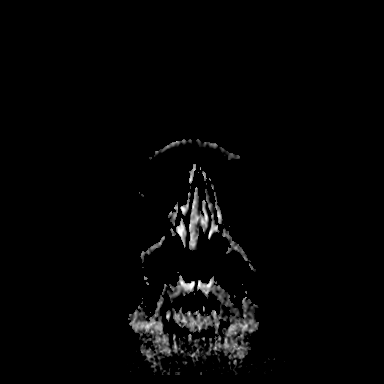
[im 41/41]
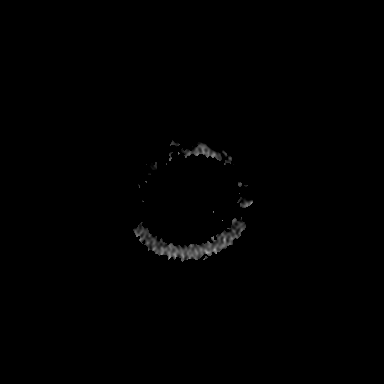

[Series 13: TOF · axial · 0.5mm · 0.41mm/px · z∈[-15,-3]mm · 2 of 224 slices shown]
[im 1/224]
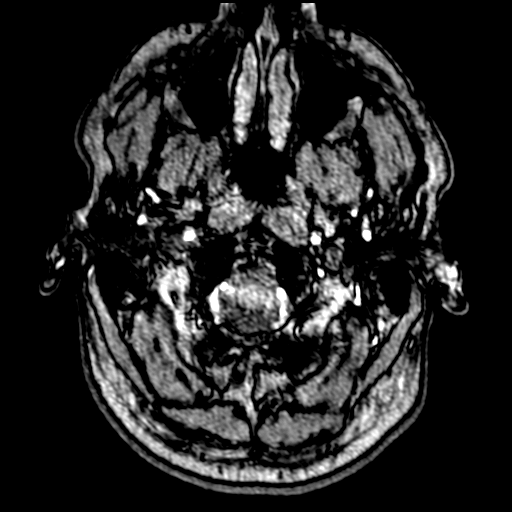
[im 25/224]
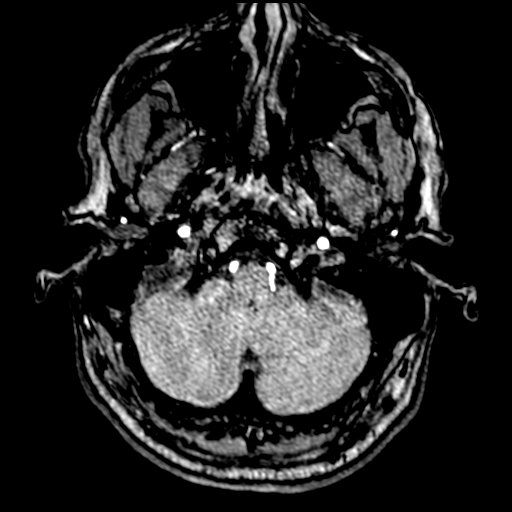

[Series 18: T1 · sagittal · 5.0mm · 0.62mm/px · 1 of 23 slices shown (1 of 4)]
[im 1/23]
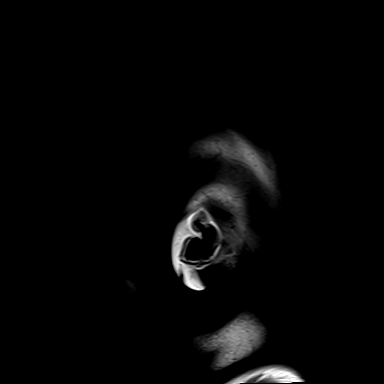

[Series 19: T2 · axial · 5.0mm · 0.55mm/px · 1 of 25 slices shown (1 of 6)]
[im 1/25]
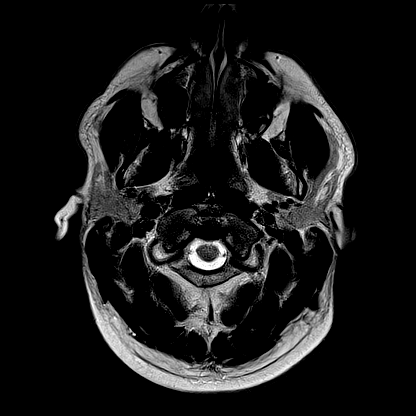

[Series 24: FLAIR · axial · 3.0mm · 0.55mm/px · z∈[-22,+139]mm · 2 of 55 slices shown (1 of 2)]
[im 1/55]
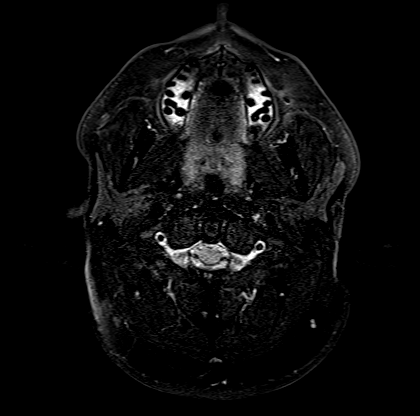
[im 55/55]
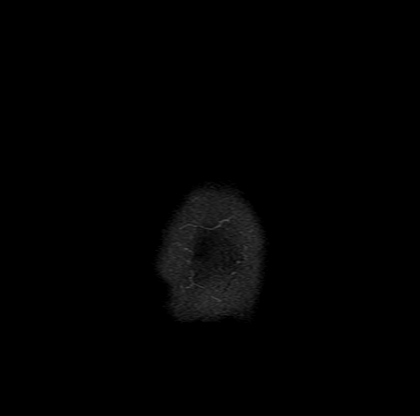

[Series 25: T1 · axial · 1.0mm · 0.98mm/px · z∈[-28,+146]mm · 8 of 176 slices shown (2 of 4)]
[im 1/176]
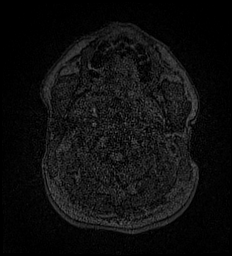
[im 26/176]
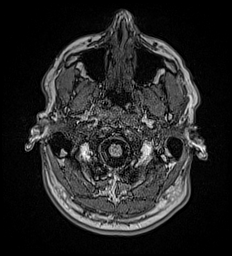
[im 51/176]
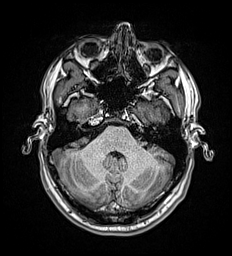
[im 76/176]
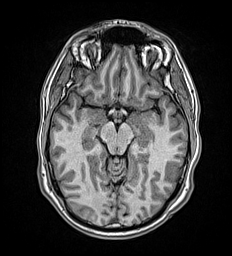
[im 101/176]
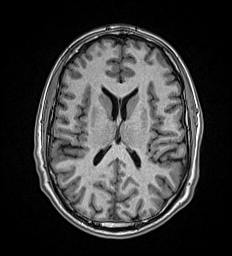
[im 126/176]
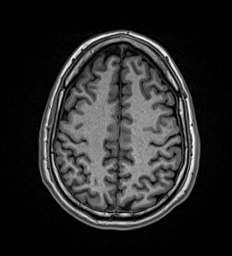
[im 151/176]
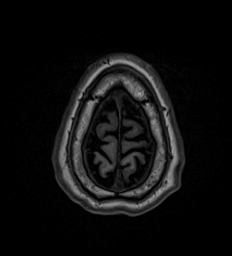
[im 176/176]
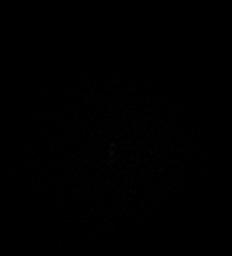

[Series 26: T2 · coronal · 5.0mm · 0.57mm/px · 1 of 29 slices shown (2 of 6)]
[im 1/29]
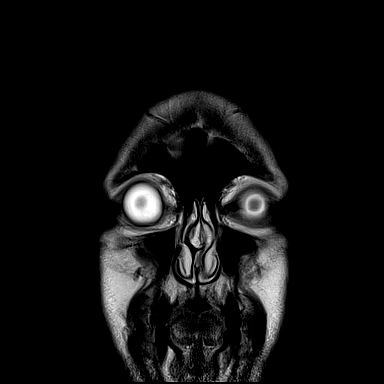

[Series 31: T2 · sagittal · 3.0mm · 0.62mm/px · 1 of 15 slices shown (3 of 6)]
[im 1/15]
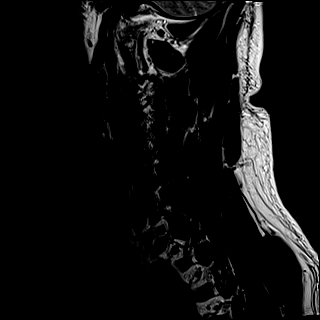

[Series 32: FLAIR · sagittal · 3.0mm · 0.78mm/px · 1 of 15 slices shown (2 of 2)]
[im 1/15]
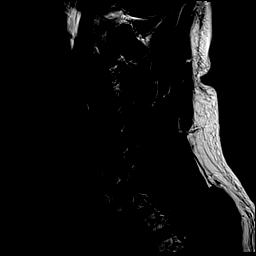

[Series 33: STIR · sagittal · 3.0mm · 0.62mm/px · 1 of 15 slices shown (1 of 2)]
[im 1/15]
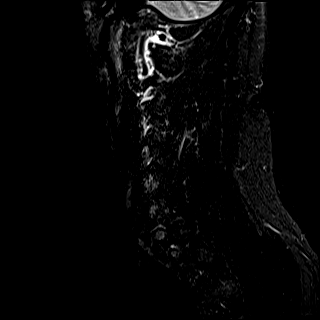

[Series 34: T2 · axial · 3.0mm · 0.70mm/px · 1 of 30 slices shown (4 of 6)]
[im 1/30]
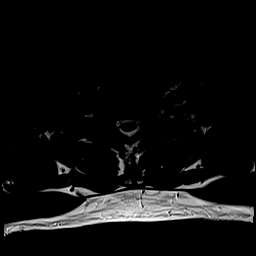

[Series 51: T1 · sagittal · 5.0mm · 1.88mm/px · 1 of 9 slices shown (3 of 4)]
[im 1/9]
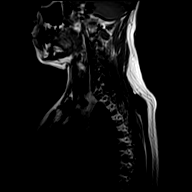

[Series 52: T2 · sagittal · 3.0mm · 1.06mm/px · 1 of 17 slices shown (5 of 6)]
[im 1/17]
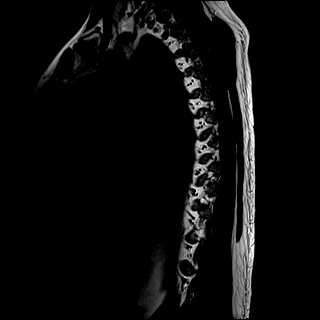

[Series 53: T1 · sagittal · 3.0mm · 1.06mm/px · 1 of 17 slices shown (4 of 4)]
[im 1/17]
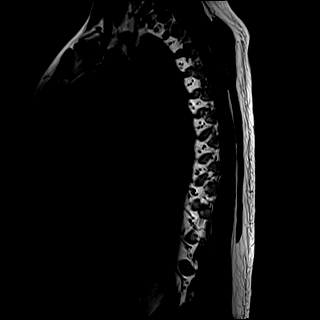

[Series 54: STIR · sagittal · 3.0mm · 0.53mm/px · 1 of 17 slices shown (2 of 2)]
[im 1/17]
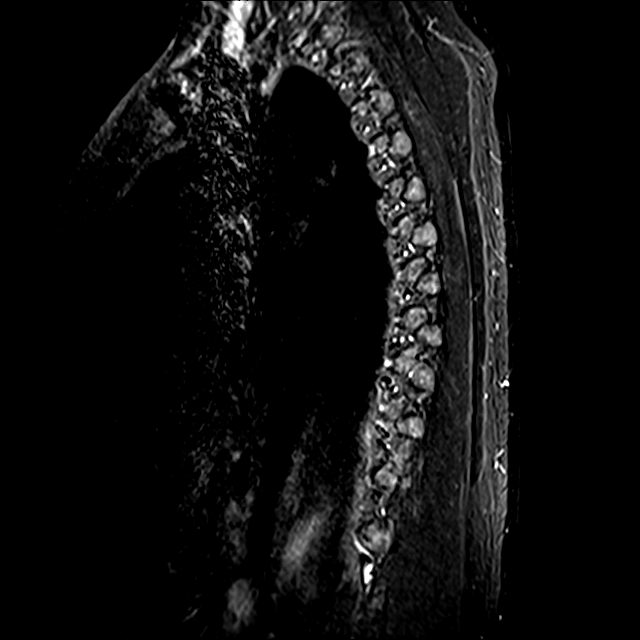

[Series 55: T2 · axial · 4.0mm · 0.59mm/px · z∈[-430,-199]mm · 2 of 36 slices shown (6 of 6)]
[im 1/36]
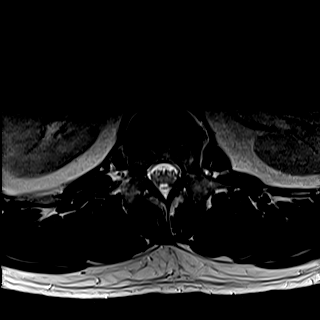
[im 36/36]
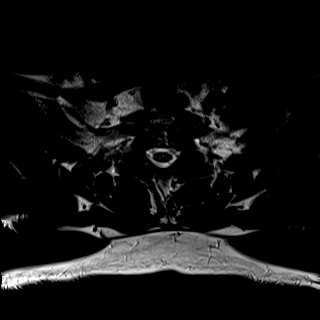

[Series 56: GRE · axial · 4.0mm · 0.37mm/px · z∈[-430,-199]mm · 2 of 36 slices shown]
[im 1/36]
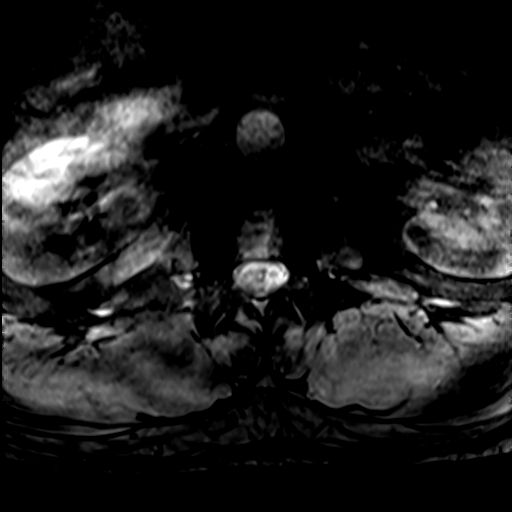
[im 36/36]
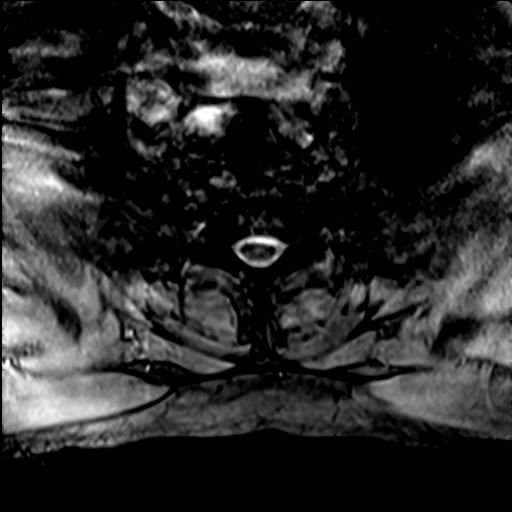

[33 of 48 positions shown; findings below may reference images not displayed]

FINDINGS: MRI HEAD FINDINGS

Brain: Cerebral volume within normal limits for patient age. No
focal parenchymal signal abnormality identified.

No abnormal foci of restricted diffusion to suggest acute or
subacute ischemia. Gray-white matter differentiation well
maintained. No encephalomalacia to suggest chronic infarction. No
foci of susceptibility artifact to suggest acute or chronic
intracranial hemorrhage.

No mass lesion, midline shift or mass effect. No hydrocephalus. No
extra-axial fluid collection. Major dural sinuses are grossly
patent.

Pituitary gland and suprasellar region are normal. Midline
structures intact and normal.

Vascular: Major intracranial vascular flow voids well maintained and
normal in appearance.

Skull and upper cervical spine: Craniocervical junction normal.
Visualized upper cervical spine within normal limits. Bone marrow
signal intensity normal. No scalp soft tissue abnormality.

Sinuses/Orbits: Globes and orbital soft tissues within normal
limits.

Paranasal sinuses are clear. No mastoid effusion. Inner ear
structures normal.

Other: None.

MRA HEAD FINDINGS

ANTERIOR CIRCULATION:

Distal cervical segments of the internal carotid arteries are patent
with symmetric antegrade flow. Petrous, cavernous, and supraclinoid
right ICA widely patent. There is an apparent focal moderate
approximate 50% stenosis at the junction of the cervical and petrous
left ICA (series 13, image 41). Petrous, cavernous, and supraclinoid
left ICA otherwise widely patent. Assessment of the origins of the
ophthalmic arteries limited by motion. A1 segments widely patent.
Normal anterior communicating artery. Anterior cerebral arteries
widely patent to their distal aspects without stenosis. M1 segments
widely patent. Normal MCA bifurcations. Distal MCA branches well
perfused and symmetric.

POSTERIOR CIRCULATION:

Vertebral arteries patent to the vertebrobasilar junction without
stenosis. Left PICA patent proximally. Right PICA not seen. Basilar
patent to its distal aspect without stenosis. Superior cerebellar
and posterior cerebral arteries well perfused bilaterally without
stenosis.

No intracranial aneurysm or other vascular abnormality.
IMPRESSION: MRI HEAD IMPRESSION:

Normal MRI of the brain. No structural findings to explain patient's
symptoms identified.

MRA HEAD IMPRESSION:

1. Apparent focal moderate irregular stenosis at the junction of the
cervical and petrous left ICA. Finding is not entirely certain, as
this is a common location for artifact. Follow-up examination with
dedicated CTA for further assessment suggested for correlative
purposes.
2. Otherwise normal intracranial MRA.

## 2020-09-11 ENCOUNTER — Other Ambulatory Visit: Payer: Self-pay

## 2020-09-11 ENCOUNTER — Other Ambulatory Visit: Payer: BC Managed Care – PPO

## 2020-09-11 DIAGNOSIS — Z20822 Contact with and (suspected) exposure to covid-19: Secondary | ICD-10-CM

## 2020-09-13 LAB — NOVEL CORONAVIRUS, NAA: SARS-CoV-2, NAA: NOT DETECTED

## 2020-09-13 LAB — SARS-COV-2, NAA 2 DAY TAT
